# Patient Record
Sex: Male | Born: 1955 | Race: White | Hispanic: No | Marital: Married
Health system: Southern US, Community
[De-identification: ages and names within clinical notes are randomized; demographics above are authoritative.]

---

## 2007-01-04 ENCOUNTER — Emergency Department (HOSPITAL_COMMUNITY): Admission: EM | Admit: 2007-01-04 | Discharge: 2007-01-04 | Payer: Self-pay | Admitting: Emergency Medicine

## 2007-10-16 IMAGING — CR DG LUMBAR SPINE COMPLETE 4+V
5 series · 5 of 5 positions shown · non-contrast
Comparison: none

CLINICAL DATA: 50-year-old male with fall and back pain.
 LUMBAR SPINE ? 5 VIEW:

[t l-spine a.p.]
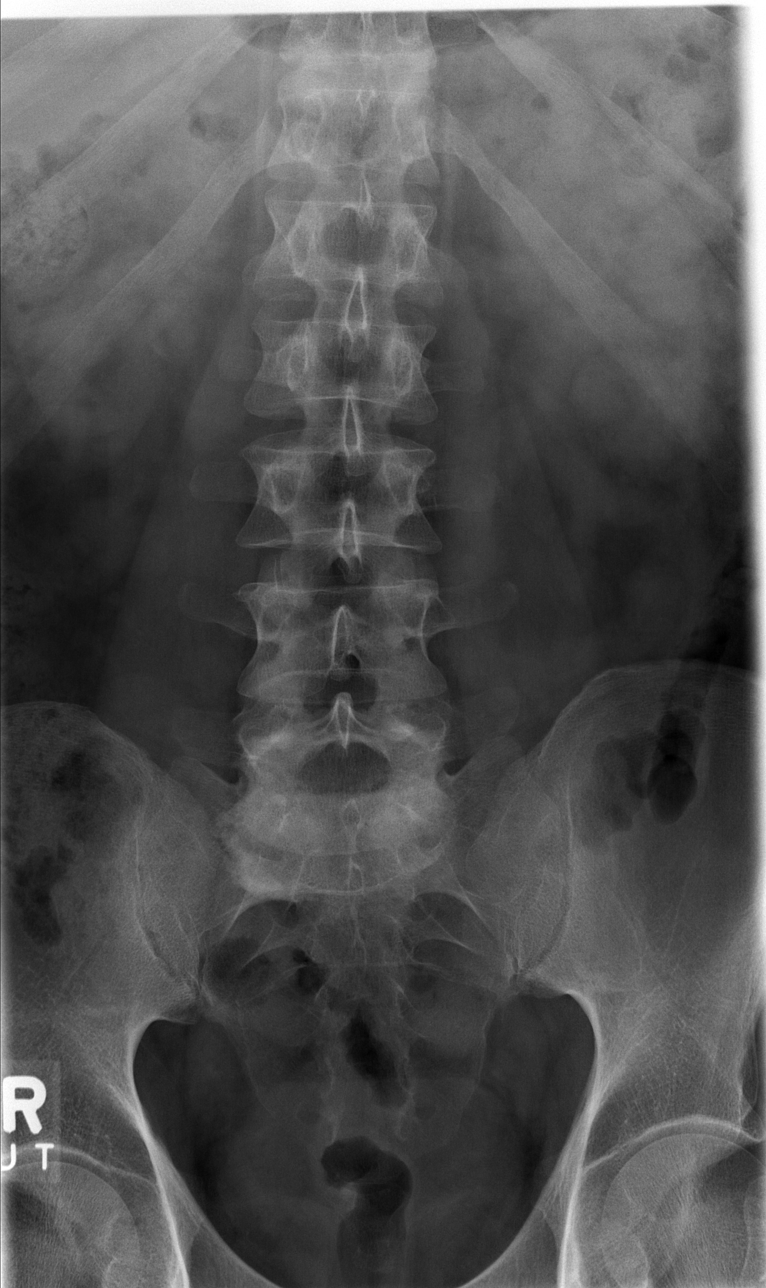

[t l-spine oblique exposure (1 of 2)]
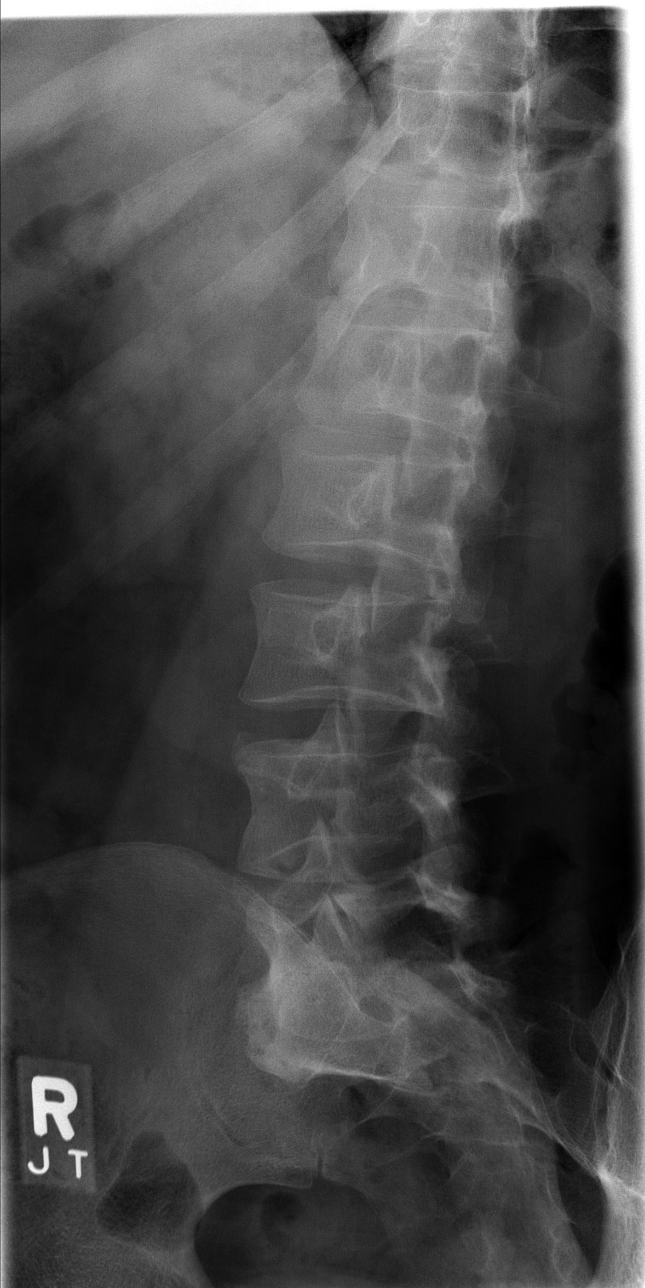

[t l-spine oblique exposure (2 of 2)]
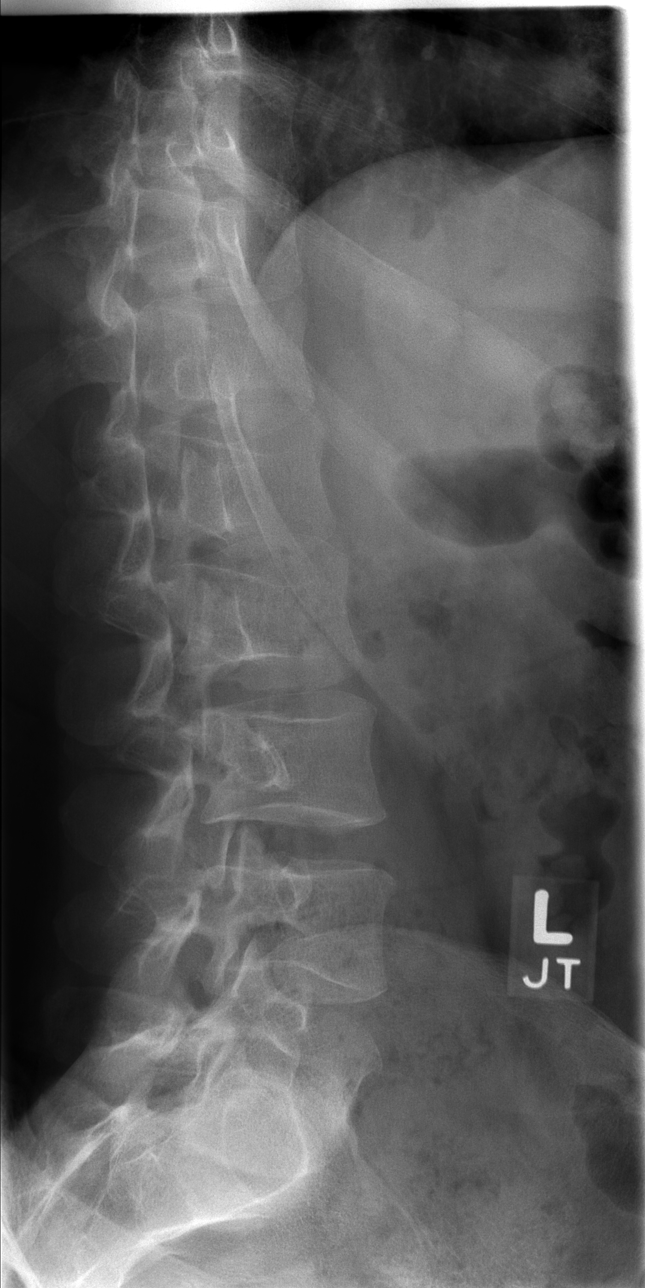

[t l-spine lat]
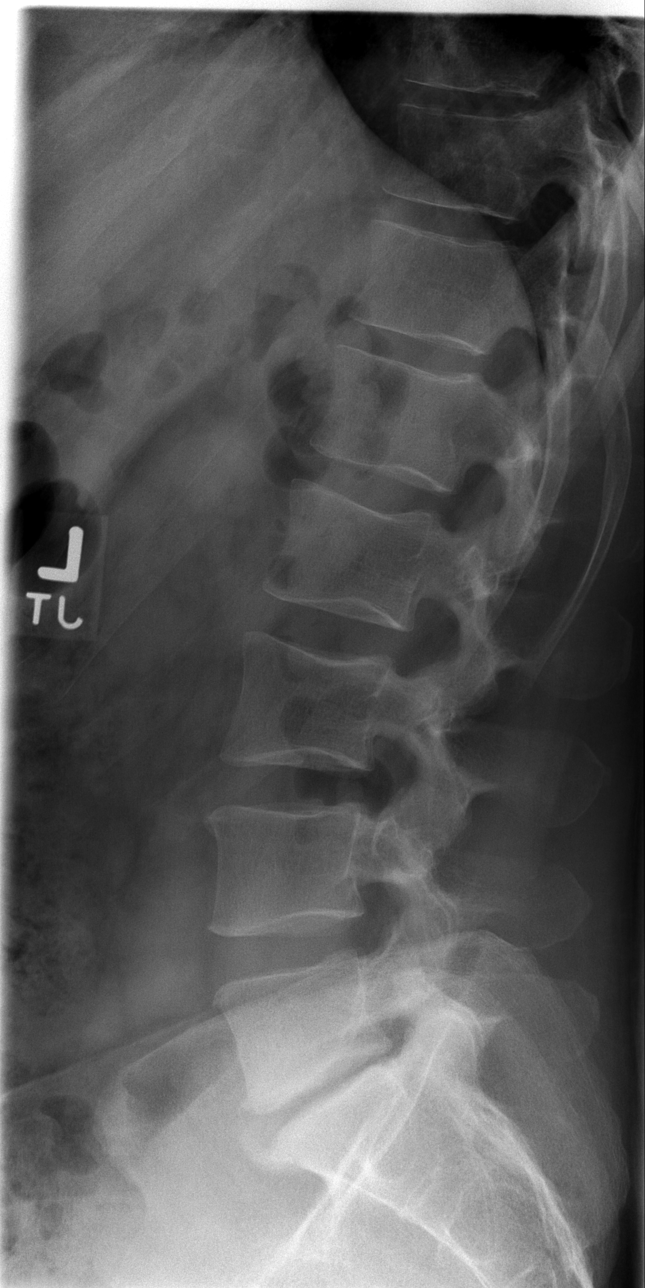

[t l-spine l5-s1 spot]
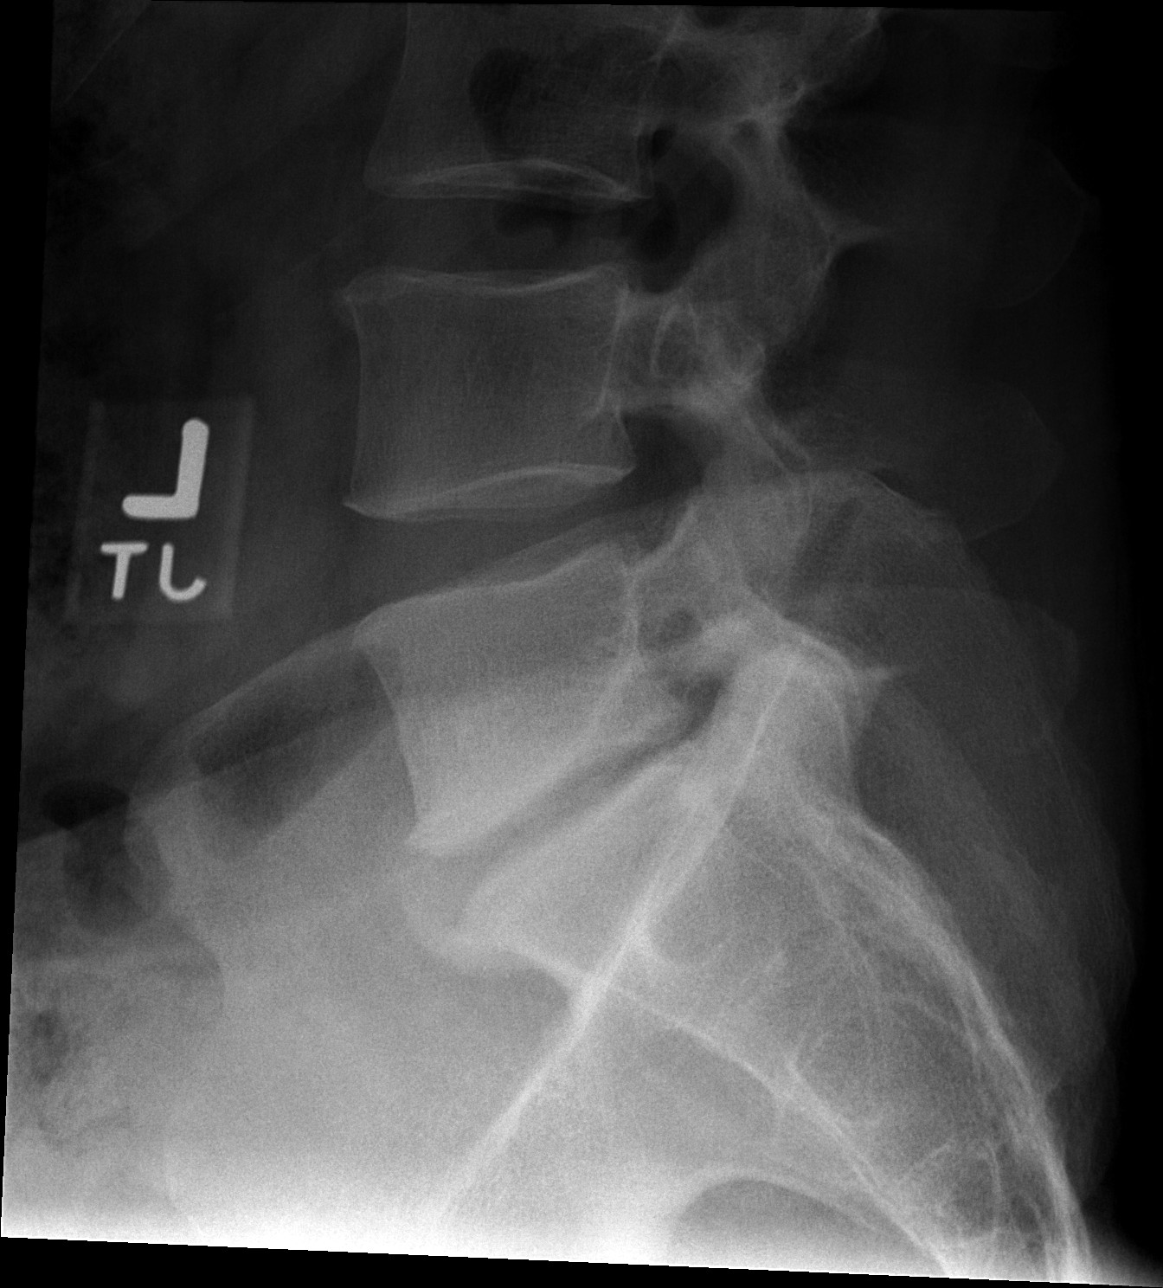

[5 of 5 positions shown; findings below may reference images not displayed]

FINDINGS: Normal alignment without subluxation or anterolisthesis.  Moderate degenerative disc disease and spondylosis is noted at L5-S1.  No compression fracture, wedging or deformity of the vertebral bodies.
IMPRESSION: 1. L5-S1 spondylosis and degenerative disc disease. 
 2. No acute finding by plain radiography.

## 2021-03-10 DIAGNOSIS — Z0000 Encounter for general adult medical examination without abnormal findings: Secondary | ICD-10-CM | POA: Diagnosis not present

## 2021-03-10 DIAGNOSIS — J309 Allergic rhinitis, unspecified: Secondary | ICD-10-CM | POA: Diagnosis not present

## 2021-03-10 DIAGNOSIS — Z125 Encounter for screening for malignant neoplasm of prostate: Secondary | ICD-10-CM | POA: Diagnosis not present

## 2021-03-10 DIAGNOSIS — Z23 Encounter for immunization: Secondary | ICD-10-CM | POA: Diagnosis not present

## 2021-03-10 DIAGNOSIS — E7800 Pure hypercholesterolemia, unspecified: Secondary | ICD-10-CM | POA: Diagnosis not present

## 2021-03-10 DIAGNOSIS — R69 Illness, unspecified: Secondary | ICD-10-CM | POA: Diagnosis not present

## 2021-03-10 DIAGNOSIS — Z1159 Encounter for screening for other viral diseases: Secondary | ICD-10-CM | POA: Diagnosis not present

## 2021-09-03 DIAGNOSIS — Z23 Encounter for immunization: Secondary | ICD-10-CM | POA: Diagnosis not present

## 2021-10-16 ENCOUNTER — Other Ambulatory Visit (HOSPITAL_BASED_OUTPATIENT_CLINIC_OR_DEPARTMENT_OTHER): Payer: Self-pay

## 2021-10-16 ENCOUNTER — Ambulatory Visit: Payer: Self-pay | Attending: Internal Medicine

## 2021-10-16 DIAGNOSIS — Z23 Encounter for immunization: Secondary | ICD-10-CM

## 2021-10-16 MED ORDER — PFIZER COVID-19 VAC BIVALENT 30 MCG/0.3ML IM SUSP
INTRAMUSCULAR | 0 refills | Status: AC
  Filled 2021-10-16: qty 0.3, 1d supply, fill #0

## 2021-10-16 NOTE — Progress Notes (Signed)
   [facility] Vaccination [facility]  Name:  CANE DUBRAY    MRN: [id number] DOB: 05-Oct-1956  10/16/2021  Mr. Laviolette was observed post Covid-19 immunization for 15 minutes without incident. He was provided with Vaccine Information Sheet and instruction to access the V-Safe system.   Mr. Britten was instructed to call 911 with any severe reactions post vaccine: Difficulty breathing  Swelling of face and throat  A fast heartbeat  A bad rash all over body  Dizziness and weakness   Immunizations Administered     Name Date Dose VIS Date Route   Pfizer Covid-19 Vaccine Bivalent Booster 10/16/2021  2:59 PM 0.3 mL 07/29/2021 Intramuscular   Manufacturer: [place]   Lot: [id number]   [place]: [phone]

## 2021-12-30 DIAGNOSIS — H31003 Unspecified chorioretinal scars, bilateral: Secondary | ICD-10-CM | POA: Diagnosis not present

## 2021-12-30 DIAGNOSIS — H5213 Myopia, bilateral: Secondary | ICD-10-CM | POA: Diagnosis not present

## 2022-03-16 DIAGNOSIS — Z23 Encounter for immunization: Secondary | ICD-10-CM | POA: Diagnosis not present

## 2022-03-16 DIAGNOSIS — E7800 Pure hypercholesterolemia, unspecified: Secondary | ICD-10-CM | POA: Diagnosis not present

## 2022-03-16 DIAGNOSIS — Z125 Encounter for screening for malignant neoplasm of prostate: Secondary | ICD-10-CM | POA: Diagnosis not present

## 2022-03-16 DIAGNOSIS — J309 Allergic rhinitis, unspecified: Secondary | ICD-10-CM | POA: Diagnosis not present

## 2022-03-16 DIAGNOSIS — R69 Illness, unspecified: Secondary | ICD-10-CM | POA: Diagnosis not present

## 2022-03-16 DIAGNOSIS — L309 Dermatitis, unspecified: Secondary | ICD-10-CM | POA: Diagnosis not present

## 2022-03-16 DIAGNOSIS — Z0000 Encounter for general adult medical examination without abnormal findings: Secondary | ICD-10-CM | POA: Diagnosis not present

## 2022-03-30 DIAGNOSIS — D229 Melanocytic nevi, unspecified: Secondary | ICD-10-CM | POA: Diagnosis not present

## 2022-03-30 DIAGNOSIS — L578 Other skin changes due to chronic exposure to nonionizing radiation: Secondary | ICD-10-CM | POA: Diagnosis not present

## 2022-03-30 DIAGNOSIS — L570 Actinic keratosis: Secondary | ICD-10-CM | POA: Diagnosis not present

## 2022-03-30 DIAGNOSIS — D179 Benign lipomatous neoplasm, unspecified: Secondary | ICD-10-CM | POA: Diagnosis not present

## 2022-03-30 DIAGNOSIS — L810 Postinflammatory hyperpigmentation: Secondary | ICD-10-CM | POA: Diagnosis not present

## 2022-03-30 DIAGNOSIS — D1801 Hemangioma of skin and subcutaneous tissue: Secondary | ICD-10-CM | POA: Diagnosis not present

## 2022-03-30 DIAGNOSIS — Z85828 Personal history of other malignant neoplasm of skin: Secondary | ICD-10-CM | POA: Diagnosis not present

## 2022-03-30 DIAGNOSIS — L814 Other melanin hyperpigmentation: Secondary | ICD-10-CM | POA: Diagnosis not present

## 2022-11-26 ENCOUNTER — Other Ambulatory Visit (HOSPITAL_COMMUNITY): Payer: Self-pay

## 2023-01-03 DIAGNOSIS — H5213 Myopia, bilateral: Secondary | ICD-10-CM | POA: Diagnosis not present

## 2023-01-03 DIAGNOSIS — H31003 Unspecified chorioretinal scars, bilateral: Secondary | ICD-10-CM | POA: Diagnosis not present

## 2023-03-23 DIAGNOSIS — F5101 Primary insomnia: Secondary | ICD-10-CM | POA: Diagnosis not present

## 2023-03-23 DIAGNOSIS — Z125 Encounter for screening for malignant neoplasm of prostate: Secondary | ICD-10-CM | POA: Diagnosis not present

## 2023-03-23 DIAGNOSIS — E7800 Pure hypercholesterolemia, unspecified: Secondary | ICD-10-CM | POA: Diagnosis not present

## 2023-03-23 DIAGNOSIS — Z0000 Encounter for general adult medical examination without abnormal findings: Secondary | ICD-10-CM | POA: Diagnosis not present

## 2024-02-24 DIAGNOSIS — Z743 Need for continuous supervision: Secondary | ICD-10-CM | POA: Diagnosis not present

## 2024-02-24 DIAGNOSIS — R531 Weakness: Secondary | ICD-10-CM | POA: Diagnosis not present

## 2024-03-30 DIAGNOSIS — Z0000 Encounter for general adult medical examination without abnormal findings: Secondary | ICD-10-CM | POA: Diagnosis not present

## 2024-03-30 DIAGNOSIS — Z125 Encounter for screening for malignant neoplasm of prostate: Secondary | ICD-10-CM | POA: Diagnosis not present

## 2024-03-30 DIAGNOSIS — E7800 Pure hypercholesterolemia, unspecified: Secondary | ICD-10-CM | POA: Diagnosis not present
